# Patient Record
Sex: Female | Born: 2000 | Hispanic: No | Marital: Single
Health system: Southern US, Community
[De-identification: ages and names within clinical notes are randomized; demographics above are authoritative.]

## PROBLEM LIST (undated history)

## (undated) HISTORY — PX: CHOLECYSTECTOMY: SHX55

---
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
## 2023-11-23 ENCOUNTER — Ambulatory Visit (INDEPENDENT_AMBULATORY_CARE_PROVIDER_SITE_OTHER): Payer: Self-pay | Admitting: Nurse Practitioner

## 2023-11-23 ENCOUNTER — Encounter: Payer: Self-pay | Admitting: Nurse Practitioner

## 2023-11-23 VITALS — BP 124/84 | HR 88 | Temp 97.6°F | Ht 68.5 in | Wt 356.4 lb

## 2023-11-23 DIAGNOSIS — N926 Irregular menstruation, unspecified: Secondary | ICD-10-CM

## 2023-11-23 DIAGNOSIS — Z136 Encounter for screening for cardiovascular disorders: Secondary | ICD-10-CM

## 2023-11-23 DIAGNOSIS — Z131 Encounter for screening for diabetes mellitus: Secondary | ICD-10-CM

## 2023-11-23 NOTE — Assessment & Plan Note (Addendum)
 Chronic, ongoing. She has been experiencing irregular periods since she was 8-22 years old. They tend to come every 1-2 months and are light spotting for several weeks. She also notes excessive hair growth. Check FSH, LH, testosterone , and TSH today. Discussed possible treatment options including metformin and OCP. Follow-up in 1-2 months.

## 2023-11-23 NOTE — Patient Instructions (Signed)
It was great to see you!  We are checking your labs today and will let you know the results via mychart/phone.   Let's follow-up in 1-2 months, sooner if you have concerns.  If a referral was placed today, you will be contacted for an appointment. Please note that routine referrals can sometimes take up to 3-4 weeks to process. Please call our office if you haven't heard anything after this time frame.  Take care,  Louden Houseworth, NP  

## 2023-11-23 NOTE — Assessment & Plan Note (Signed)
 BMI 53.4. She has lost 10 pounds over the last year. Check TSH, vitamin D , vitamin B12 today. Discussed nutrition, exercise.

## 2023-11-23 NOTE — Progress Notes (Signed)
 New Patient Visit  BP 124/84 (BP Location: Left Arm, Patient Position: Sitting, Cuff Size: Large)   Pulse 88   Temp 97.6 F (36.4 C)   Ht 5' 8.5 (1.74 m)   Wt (!) 356 lb 6.4 oz (161.7 kg)   SpO2 97%   BMI 53.40 kg/m    Subjective:    Patient ID: Sandra Franklin, female    DOB: 2000-08-30, 23 y.o.   MRN: [id number]  CC: Chief Complaint  Patient presents with   Establish Care    P. Est. Care, concerns with PCOS and testing    HPI: Sandra Franklin is a 23 y.o. female presents for new patient visit to establish care.  Introduced to Publishing rights manager role and practice setting.  All questions answered.  Discussed provider/patient relationship and expectations.  Discussed the use of AI scribe software for clinical note transcription with the patient, who gave verbal consent to proceed.  History of Present Illness   Sandra Franklin is a 23 year old female who presents to establish care with concerns about possible PCOS.  Menstrual cycles are irregular, occurring once a month or once every two months, with very light bleeding that can last almost a month. She experiences excess body hair and has difficulty losing weight. She lost ten pounds following gallbladder surgery in 2023 due to dietary changes, including avoiding fatty foods and consuming nonfat yogurt. Bowel movements are regular without diarrhea post-surgery.  No chest pain, shortness of breath, rashes, skin problems, muscle or joint pain, frequent urination, burning during urination, dizziness, or headaches beyond occasional ones. No excessive thirst, hunger, cold, or heat intolerance. She feels tired but denies significant anxiety, worry, or depression beyond normal sadness.  Past medical history includes a cholecystectomy in December 2023. She has no history of smoking or vaping and drinks alcohol socially. She is not sexually active and has never had a Pap smear. Family history is significant for ovarian cancer on her mother's  side, with many relatives having undergone hysterectomies.     Depression and Anxiety Screen done:     11/23/2023    3:13 PM  Depression screen PHQ 2/9  Decreased Interest 1  Down, Depressed, Hopeless 0  PHQ - 2 Score 1  Altered sleeping 2  Tired, decreased energy 3  Change in appetite 3  Feeling bad or failure about yourself  0  Trouble concentrating 0  Moving slowly or fidgety/restless 0  Suicidal thoughts 0  PHQ-9 Score 9  Difficult doing work/chores Somewhat difficult      11/23/2023    3:13 PM  GAD 7 : Generalized Anxiety Score  Nervous, Anxious, on Edge 0  Control/stop worrying 2  Worry too much - different things 3  Trouble relaxing 0  Restless 0  Easily annoyed or irritable 1  Afraid - awful might happen 0  Total GAD 7 Score 6  Anxiety Difficulty Not difficult at all    History reviewed. No pertinent past medical history.  Past Surgical History:  Procedure Laterality Date   CHOLECYSTECTOMY      Family History  Problem Relation Age of Onset   Healthy Mother    Cancer Paternal Aunt        ovarian     Social History   Tobacco Use   Smoking status: Never   Smokeless tobacco: Never  Vaping Use   Vaping status: Never Used  Substance Use Topics   Alcohol use: No   Drug use: No  No current outpatient medications on file prior to visit.   No current facility-administered medications on file prior to visit.     Review of Systems  Constitutional:  Positive for fatigue. Negative for fever.  HENT: Negative.    Respiratory: Negative.    Cardiovascular: Negative.   Gastrointestinal: Negative.   Endocrine: Negative.   Genitourinary:  Positive for menstrual problem. Negative for dysuria, frequency and urgency.  Musculoskeletal: Negative.   Skin: Negative.   Neurological: Negative.   Psychiatric/Behavioral: Negative.        Objective:    BP 124/84 (BP Location: Left Arm, Patient Position: Sitting, Cuff Size: Large)   Pulse 88   Temp 97.6 F  (36.4 C)   Ht 5' 8.5 (1.74 m)   Wt (!) 356 lb 6.4 oz (161.7 kg)   SpO2 97%   BMI 53.40 kg/m   Wt Readings from Last 3 Encounters:  11/23/23 (!) 356 lb 6.4 oz (161.7 kg)  07/11/13 211 lb (95.7 kg) (>99%, Z= 3.04)*   * Growth percentiles are based on CDC (Girls, 2-20 Years) data.    BP Readings from Last 3 Encounters:  11/23/23 124/84  07/11/13 (!) 121/74 (92%, Z = 1.41 /  88%, Z = 1.17)*   *BP percentiles are based on the 2017 AAP Clinical Practice Guideline for girls    Physical Exam Vitals and nursing note reviewed.  Constitutional:      General: She is not in acute distress.    Appearance: Normal appearance.  HENT:     Head: Normocephalic and atraumatic.     Right Ear: Tympanic membrane, ear canal and external ear normal.     Left Ear: Tympanic membrane, ear canal and external ear normal.     Mouth/Throat:     Mouth: Mucous membranes are moist.     Pharynx: No posterior oropharyngeal erythema.  Eyes:     Conjunctiva/sclera: Conjunctivae normal.  Cardiovascular:     Rate and Rhythm: Normal rate and regular rhythm.     Pulses: Normal pulses.     Heart sounds: Normal heart sounds.  Pulmonary:     Effort: Pulmonary effort is normal.     Breath sounds: Normal breath sounds.  Abdominal:     Palpations: Abdomen is soft.     Tenderness: There is no abdominal tenderness.  Musculoskeletal:        General: Normal range of motion.     Cervical back: Normal range of motion and neck supple.     Right lower leg: No edema.     Left lower leg: No edema.  Lymphadenopathy:     Cervical: No cervical adenopathy.  Skin:    General: Skin is warm and dry.  Neurological:     General: No focal deficit present.     Mental Status: She is alert and oriented to person, place, and time.     Cranial Nerves: No cranial nerve deficit.     Coordination: Coordination normal.     Gait: Gait normal.  Psychiatric:        Mood and Affect: Mood normal.        Behavior: Behavior normal.         Thought Content: Thought content normal.        Judgment: Judgment normal.        Assessment & Plan:   Problem List Items Addressed This Visit       Other   Irregular periods - Primary   Chronic, ongoing. She has been experiencing  irregular periods since she was 6-53 years old. They tend to come every 1-2 months and are light spotting for several weeks. She also notes excessive hair growth. Check FSH, LH, testosterone , and TSH today. Discussed possible treatment options including metformin and OCP. Follow-up in 1-2 months.       Relevant Orders   TSH   FSH/LH   Testosterone    Morbid obesity (HCC)   BMI 53.4. She has lost 10 pounds over the last year. Check TSH, vitamin D , vitamin B12 today. Discussed nutrition, exercise.       Relevant Orders   CBC with Differential/Platelet   Comprehensive metabolic panel with GFR   TSH   Vitamin B12   VITAMIN D  25 Hydroxy (Vit-D Deficiency, Fractures)   Other Visit Diagnoses       Screening for cardiovascular condition       Screen baseline lipid panel today   Relevant Orders   Lipid panel     Screening for diabetes mellitus       Screen A1c today   Relevant Orders   Hemoglobin A1c        Follow up plan: Return in about 2 months (around 01/23/2024) for 1-2 months , CPE.  Sandra Franklin

## 2023-11-24 ENCOUNTER — Ambulatory Visit: Payer: Self-pay | Admitting: Nurse Practitioner

## 2023-11-24 LAB — COMPREHENSIVE METABOLIC PANEL WITH GFR
ALT: 34 U/L (ref 0–35)
AST: 27 U/L (ref 0–37)
Albumin: 4.2 g/dL (ref 3.5–5.2)
Alkaline Phosphatase: 94 U/L (ref 39–117)
BUN: 13 mg/dL (ref 6–23)
CO2: 27 meq/L (ref 19–32)
Calcium: 9.8 mg/dL (ref 8.4–10.5)
Chloride: 102 meq/L (ref 96–112)
Creatinine, Ser: 0.71 mg/dL (ref 0.40–1.20)
GFR: 120.85 mL/min (ref >60.00)
Glucose, Bld: 86 mg/dL (ref 70–99)
Potassium: 4.1 meq/L (ref 3.5–5.1)
Sodium: 140 meq/L (ref 135–145)
Total Bilirubin: 0.4 mg/dL (ref 0.2–1.2)
Total Protein: 7.8 g/dL (ref 6.0–8.3)

## 2023-11-24 LAB — CBC WITH DIFFERENTIAL/PLATELET
Basophils Absolute: 0 K/uL (ref 0.0–0.1)
Basophils Relative: 0.5 % (ref 0.0–3.0)
Eosinophils Absolute: 0 K/uL (ref 0.0–0.7)
Eosinophils Relative: 0.7 % (ref 0.0–5.0)
HCT: 42.3 % (ref 36.0–46.0)
Hemoglobin: 14 g/dL (ref 12.0–15.0)
Lymphocytes Relative: 27 % (ref 12.0–46.0)
Lymphs Abs: 1.9 K/uL (ref 0.7–4.0)
MCHC: 33.1 g/dL (ref 30.0–36.0)
MCV: 93.4 fl (ref 78.0–100.0)
Monocytes Absolute: 0.5 K/uL (ref 0.1–1.0)
Monocytes Relative: 7.3 % (ref 3.0–12.0)
Neutro Abs: 4.6 K/uL (ref 1.4–7.7)
Neutrophils Relative %: 64.5 % (ref 43.0–77.0)
Platelets: 296 K/uL (ref 150.0–400.0)
RBC: 4.53 Mil/uL (ref 3.87–5.11)
RDW: 13.8 % (ref 11.5–15.5)
WBC: 7.1 K/uL (ref 4.0–10.5)

## 2023-11-24 LAB — LIPID PANEL
Cholesterol: 217 mg/dL — ABNORMAL HIGH (ref 0–200)
HDL: 51.3 mg/dL (ref >39.00)
LDL Cholesterol: 135 mg/dL — ABNORMAL HIGH (ref 0–99)
NonHDL: 165.46
Total CHOL/HDL Ratio: 4
Triglycerides: 150 mg/dL — ABNORMAL HIGH (ref 0.0–149.0)
VLDL: 30 mg/dL (ref 0.0–40.0)

## 2023-11-24 LAB — TESTOSTERONE: Testosterone: 105.73 ng/dL — ABNORMAL HIGH (ref 15.00–40.00)

## 2023-11-24 LAB — VITAMIN D 25 HYDROXY (VIT D DEFICIENCY, FRACTURES): VITD: 10.33 ng/mL — ABNORMAL LOW (ref 30.00–100.00)

## 2023-11-24 LAB — TSH: TSH: 1.75 u[IU]/mL (ref 0.35–5.50)

## 2023-11-24 LAB — HEMOGLOBIN A1C: Hgb A1c MFr Bld: 6 % (ref 4.6–6.5)

## 2023-11-24 LAB — VITAMIN B12: Vitamin B-12: 143 pg/mL — ABNORMAL LOW (ref 211–911)

## 2023-11-24 LAB — FSH/LH
FSH: 6.8 m[IU]/mL
LH: 10.7 m[IU]/mL

## 2023-11-24 MED ORDER — VITAMIN D (ERGOCALCIFEROL) 1.25 MG (50000 UNIT) PO CAPS: 50000.0000 [IU] | ORAL_CAPSULE | ORAL | 0 refills | Status: AC

## 2024-01-24 ENCOUNTER — Encounter: Payer: Self-pay | Admitting: Nurse Practitioner

## 2024-01-26 ENCOUNTER — Encounter: Admitting: Nurse Practitioner

## 2024-02-06 ENCOUNTER — Encounter: Payer: Self-pay | Admitting: Nurse Practitioner
